# Patient Record
Sex: Male | Born: 2000 | Hispanic: No | Marital: Single
Health system: Southern US, Community
[De-identification: ages and names within clinical notes are randomized; demographics above are authoritative.]

---

## 2020-05-23 ENCOUNTER — Emergency Department (HOSPITAL_COMMUNITY): Payer: BC Managed Care – PPO

## 2020-05-23 ENCOUNTER — Emergency Department (HOSPITAL_COMMUNITY)
Admission: EM | Admit: 2020-05-23 | Discharge: 2020-05-23 | Disposition: A | Discharge status: Home / Self Care | Payer: BC Managed Care – PPO | Attending: Emergency Medicine | Admitting: Emergency Medicine

## 2020-05-23 ENCOUNTER — Other Ambulatory Visit: Payer: Self-pay

## 2020-05-23 DIAGNOSIS — R109 Unspecified abdominal pain: Secondary | ICD-10-CM | POA: Diagnosis present

## 2020-05-23 DIAGNOSIS — N200 Calculus of kidney: Secondary | ICD-10-CM | POA: Insufficient documentation

## 2020-05-23 LAB — CBC WITH DIFFERENTIAL/PLATELET
Abs Immature Granulocytes: 0.02 10*3/uL (ref 0.00–0.07)
Basophils Absolute: 0 10*3/uL (ref 0.0–0.1)
Basophils Relative: 0 %
Eosinophils Absolute: 0.1 10*3/uL (ref 0.0–0.5)
Eosinophils Relative: 2 %
HCT: 44.5 % (ref 39.0–52.0)
Hemoglobin: 15 g/dL (ref 13.0–17.0)
Immature Granulocytes: 0 %
Lymphocytes Relative: 34 %
Lymphs Abs: 2.6 10*3/uL (ref 0.7–4.0)
MCH: 28.7 pg (ref 26.0–34.0)
MCHC: 33.7 g/dL (ref 30.0–36.0)
MCV: 85.1 fL (ref 80.0–100.0)
Monocytes Absolute: 0.4 10*3/uL (ref 0.1–1.0)
Monocytes Relative: 5 %
Neutro Abs: 4.4 10*3/uL (ref 1.7–7.7)
Neutrophils Relative %: 59 %
Platelets: 244 10*3/uL (ref 150–400)
RBC: 5.23 MIL/uL (ref 4.22–5.81)
RDW: 12.5 % (ref 11.5–15.5)
WBC: 7.5 10*3/uL (ref 4.0–10.5)
nRBC: 0 % (ref 0.0–0.2)

## 2020-05-23 LAB — COMPREHENSIVE METABOLIC PANEL
ALT: 18 U/L (ref 0–44)
AST: 16 U/L (ref 15–41)
Albumin: 4.6 g/dL (ref 3.5–5.0)
Alkaline Phosphatase: 57 U/L (ref 38–126)
Anion gap: 11 (ref 5–15)
BUN: 15 mg/dL (ref 6–20)
CO2: 21 mmol/L — ABNORMAL LOW (ref 22–32)
Calcium: 9.6 mg/dL (ref 8.9–10.3)
Chloride: 104 mmol/L (ref 98–111)
Creatinine, Ser: 0.97 mg/dL (ref 0.61–1.24)
GFR calc non Af Amer: >60 mL/min (ref >60)
Glucose, Bld: 128 mg/dL — ABNORMAL HIGH (ref 70–99)
Potassium: 3.3 mmol/L — ABNORMAL LOW (ref 3.5–5.1)
Sodium: 136 mmol/L (ref 135–145)
Total Bilirubin: 1.1 mg/dL (ref 0.3–1.2)
Total Protein: 7.3 g/dL (ref 6.5–8.1)

## 2020-05-23 LAB — URINALYSIS, ROUTINE W REFLEX MICROSCOPIC
Bilirubin Urine: NEGATIVE
Glucose, UA: NEGATIVE mg/dL
Ketones, ur: NEGATIVE mg/dL
Leukocytes,Ua: NEGATIVE
Nitrite: NEGATIVE
Protein, ur: 30 mg/dL — AB
RBC / HPF: >50 RBC/hpf — ABNORMAL HIGH (ref 0–5)
Specific Gravity, Urine: 1.008 (ref 1.005–1.030)
pH: 6 (ref 5.0–8.0)

## 2020-05-23 LAB — LIPASE, BLOOD: Lipase: 28 U/L (ref 11–51)

## 2020-05-23 MED ORDER — IBUPROFEN 800 MG PO TABS: 800.0000 mg | ORAL_TABLET | Freq: Three times a day (TID) | ORAL | 0 refills | Status: AC

## 2020-05-23 MED ORDER — KETOROLAC TROMETHAMINE 30 MG/ML IJ SOLN
30.0000 mg | Freq: Once | INTRAMUSCULAR | Status: AC
  Administered 2020-05-23: 30 mg via INTRAVENOUS
  Filled 2020-05-23: qty 1

## 2020-05-23 NOTE — ED Triage Notes (Signed)
Patient brought in by [facility] for c/o from [facility] dorms abdominal pain with blood and urine. 128/82,78,99%ra,18.

## 2020-05-23 NOTE — ED Provider Notes (Signed)
[facility]-EMERGENCY DEPT Provider Note   CSN: [id number] Arrival date & time: 05/23/20  0636     History Chief Complaint  Patient presents with   Abdominal Pain    Patient here with c/o abdominal pain with nausea and vomiting.    William Houston is a 19 y.o. male.  HPI 19 year old male with no significant medical history resents to the ER with a sudden onset of left-sided back pain which occurred yesterday.  Patient states that the pain was sharp and stabbing, however subsided throughout the evening.  He states he wrote it off and went to bed.  He then awoke several hours later with another bout of left-sided sharp pain.  He has had some nausea, and one episode of nonbloody nonbilious vomiting.  He also noticed frank blood in his urine.  He does not have a history of kidney stones, but states that his father and his grandfather has them.  He suspects that this is the etiology of his pain.  He denies any fevers or chills.  Denies any dysuria, diarrhea, constipation.  No congestion, cough, chest pain, shortness of breath.  He had not taken anything for his pain, but was given something by EMS and states that his pain has now improved.  Denies any penile discharge, pain, swelling, no testicular pain.  Has been able to urinate without difficulty.    No past medical history on file.  There are no problems to display for this patient.   The histories are not reviewed yet. Please review them in the "History" navigator section and refresh this SmartLink.     No family history on file.  Social History   Tobacco Use   Smoking status: Not on file  Substance Use Topics   Alcohol use: Not on file   Drug use: Not on file    Home Medications Prior to Admission medications   Medication Sig Start Date End Date Taking? Authorizing Provider  ibuprofen (ADVIL) 800 MG tablet Take 1 tablet (800 mg total) by mouth 3 (three) times daily. 05/23/20   Mare Ferrari, PA-C     Allergies    Patient has no known allergies.  Review of Systems   Review of Systems  Constitutional: Negative for chills and fever.  HENT: Negative for ear pain and sore throat.   Eyes: Negative for pain and visual disturbance.  Respiratory: Negative for cough and shortness of breath.   Cardiovascular: Negative for chest pain and palpitations.  Gastrointestinal: Positive for abdominal pain, nausea and vomiting. Negative for constipation and diarrhea.  Genitourinary: Positive for hematuria. Negative for decreased urine volume, discharge, dysuria, penile pain, penile swelling and urgency.  Musculoskeletal: Negative for arthralgias and back pain.  Skin: Negative for color change and rash.  Neurological: Negative for seizures and syncope.  All other systems reviewed and are negative.   Physical Exam Updated Vital Signs BP 106/72    Pulse 65    Temp 97.6 F (36.4 C) (Oral)    Resp 17    Ht 5\' 10"  (1.778 m)    Wt 50.8 kg    SpO2 99%    BMI 16.07 kg/m   Physical Exam Vitals and nursing note reviewed.  Constitutional:      General: He is not in acute distress.    Appearance: He is well-developed. He is not ill-appearing, toxic-appearing or diaphoretic.  HENT:     Head: Normocephalic and atraumatic.  Eyes:     Conjunctiva/sclera: Conjunctivae normal.  Cardiovascular:  Rate and Rhythm: Normal rate and regular rhythm.     Heart sounds: Normal heart sounds. No murmur heard.   Pulmonary:     Effort: Pulmonary effort is normal. No respiratory distress.     Breath sounds: Normal breath sounds.  Abdominal:     General: Abdomen is flat. Bowel sounds are normal.     Palpations: Abdomen is soft.     Tenderness: There is no abdominal tenderness. There is left CVA tenderness (Mild left-sided CVA tenderness). There is no right CVA tenderness. Negative signs include Murphy's sign and McBurney's sign.  Musculoskeletal:     Cervical back: Neck supple.  Skin:    General: Skin is warm  and dry.     Coloration: Skin is not jaundiced, mottled or pale.  Neurological:     General: No focal deficit present.     Mental Status: He is alert.  Psychiatric:        Mood and Affect: Mood normal.        Behavior: Behavior normal.     ED Results / Procedures / Treatments   Labs (all labs ordered are listed, but only abnormal results are displayed) Labs Reviewed  COMPREHENSIVE METABOLIC PANEL - Abnormal; Notable for the following components:      Result Value   Potassium 3.3 (*)    CO2 21 (*)    Glucose, Bld 128 (*)    All other components within normal limits  URINALYSIS, ROUTINE W REFLEX MICROSCOPIC - Abnormal; Notable for the following components:   Hgb urine dipstick LARGE (*)    Protein, ur 30 (*)    RBC / HPF >50 (*)    Bacteria, UA FEW (*)    All other components within normal limits  CBC WITH DIFFERENTIAL/PLATELET  LIPASE, BLOOD    EKG None  Radiology CT Renal Stone Study  Result Date: 05/23/2020 CLINICAL DATA:  Acute left flank pain. EXAM: CT ABDOMEN AND PELVIS WITHOUT CONTRAST TECHNIQUE: Multidetector CT imaging of the abdomen and pelvis was performed following the standard protocol without IV contrast. COMPARISON:  None. FINDINGS: Lower chest: No acute abnormality. Hepatobiliary: No focal liver abnormality is seen. No gallstones, gallbladder wall thickening, or biliary dilatation. Pancreas: Unremarkable. No pancreatic ductal dilatation or surrounding inflammatory changes. Spleen: Normal in size without focal abnormality. Adrenals/Urinary Tract: Adrenal glands are unremarkable. Right kidney and ureter are unremarkable. Mild left hydronephrosis is noted secondary to 5 mm calculus in the proximal left ureter. Urinary bladder is unremarkable. Stomach/Bowel: Stomach is within normal limits. Appendix appears normal. No evidence of bowel wall thickening, distention, or inflammatory changes. Vascular/Lymphatic: No significant vascular findings are present. No enlarged  abdominal or pelvic lymph nodes. Reproductive: Prostate is unremarkable. Other: No abdominal wall hernia or abnormality. No abdominopelvic ascites. Musculoskeletal: No acute or significant osseous findings. IMPRESSION: Mild left hydronephrosis is noted secondary to 5 mm proximal left ureteral calculus. Electronically Signed   By: Lupita Raider M.D.   On: 05/23/2020 08:23    Procedures Procedures (including critical care time)  Medications Ordered in ED Medications - No data to display  ED Course  I have reviewed the triage vital signs and the nursing notes.  Pertinent labs & imaging results that were available during my care of the patient were reviewed by me and considered in my medical decision making (see chart for details).    MDM Rules/Calculators/A&P  19 year old male with left flank pain Presentation, he is alert, oriented, nontoxic-appearing, in no acute distress.  His physical exam is positive for left-sided flank tenderness, however no right lower quadrant tenderness, no left lower quadrant tenderness and negative Murphy's.  His vitals are overall reassuring.  His CBC is without leukocytosis, mild hypokalemia of 3.3, glucose of 128.  His lipase is normal.  His UA is consistent with large hemoglobin and mild proteinuria.  His CT renal study does confirm a left ureteral calculus with mild left hydronephrosis.  Evidence of obstruction.  Patient's pain under control, provided additional toradol.  Will send home with 800 mg of ibuprofen.  Encouraged to follow-up with urology.  Return precautions discussed.  He voiced understanding and is agreeable.  At this stage in the ED course, the patient medically screened and stable for discharge.  Final Clinical Impression(s) / ED Diagnoses Final diagnoses:  Kidney stone on left side    Rx / DC Orders ED Discharge Orders         Ordered    ibuprofen (ADVIL) 800 MG tablet  3 times daily        05/23/20 0909            Mare Ferrari, PA-C 05/23/20 6122    Lorre Nick, MD 05/27/20 1404

## 2020-05-23 NOTE — Discharge Instructions (Addendum)
Thank you for letting us take care of you in the ER  Your work-up today showed that you do have a  kidney stone in the left kidney.  Please take  800mg  of Ibuprofen up to 3 times daily for pain.  Please follow-up with Dr. with urology, whose contact information is provided in your discharge paperwork.  Return to the ER for any new or worsening symptoms.

## 2021-03-13 IMAGING — CT CT RENAL STONE PROTOCOL
2 of 4 series · 16 of 46 positions shown, 18 images · non-contrast
Comparison: None.

CLINICAL DATA: Acute left flank pain.

EXAM:
CT ABDOMEN AND PELVIS WITHOUT CONTRAST
TECHNIQUE: Multidetector CT imaging of the abdomen and pelvis was performed
following the standard protocol without IV contrast.

[Series 2: axial st · axial · 0.56mm/px · z∈[-456,-76]mm · 13 of 86 slices shown, 15 images]
[im 5/86  soft-tissue]
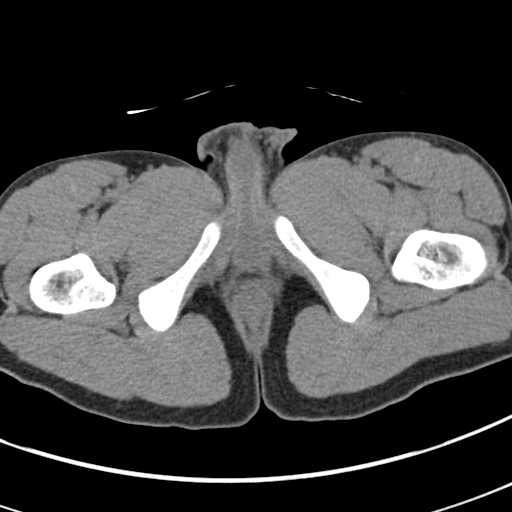
[im 5/86  bone]
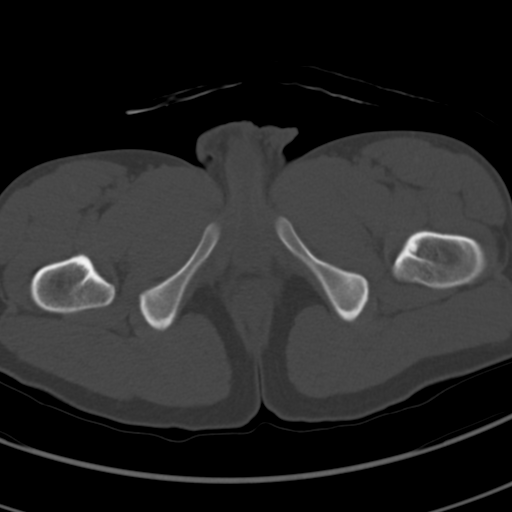
[im 10/86  soft-tissue]
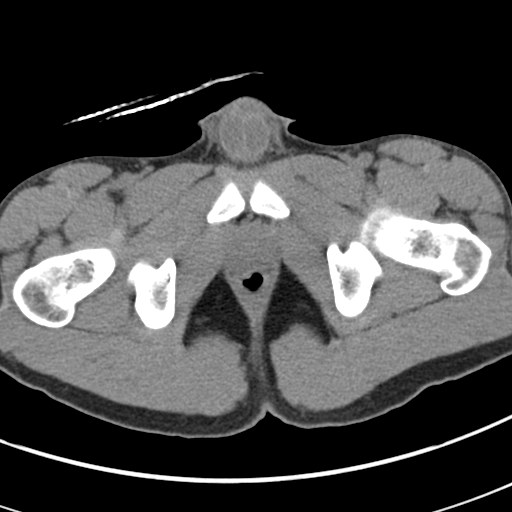
[im 19/86  soft-tissue]
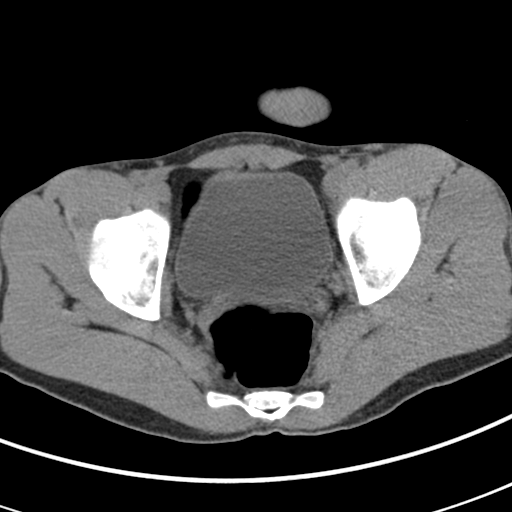
[im 24/86  soft-tissue]
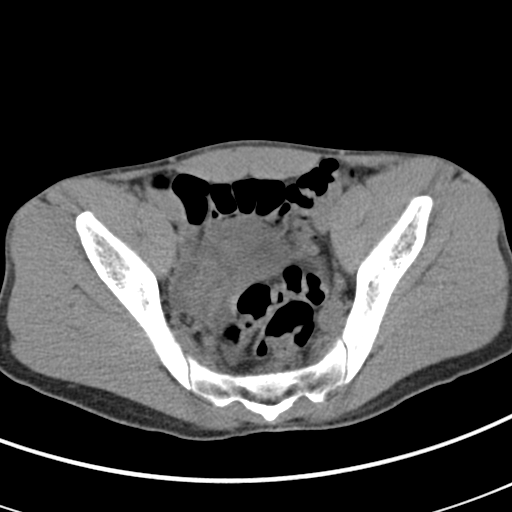
[im 29/86  soft-tissue]
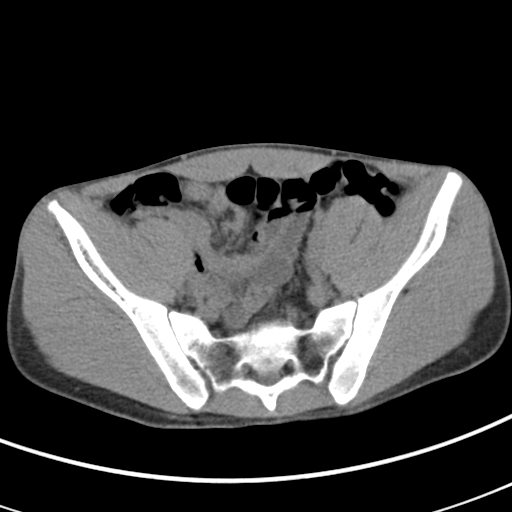
[im 38/86  soft-tissue]
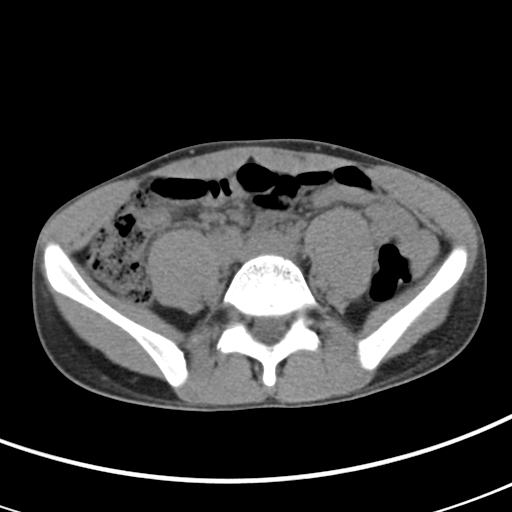
[im 43/86  soft-tissue]
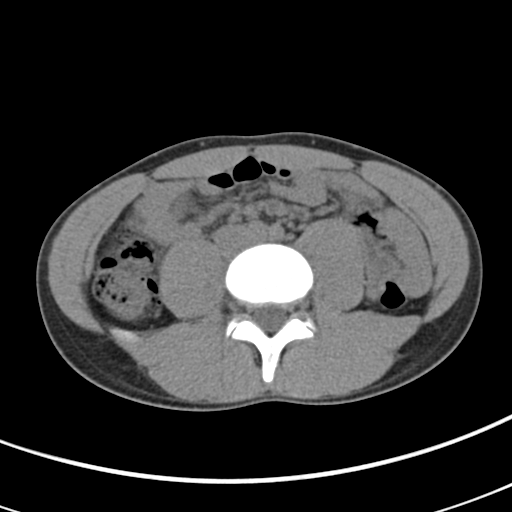
[im 48/86  soft-tissue]
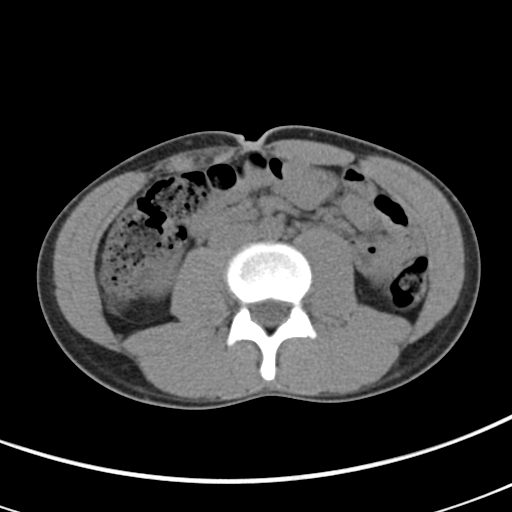
[im 57/86  soft-tissue]
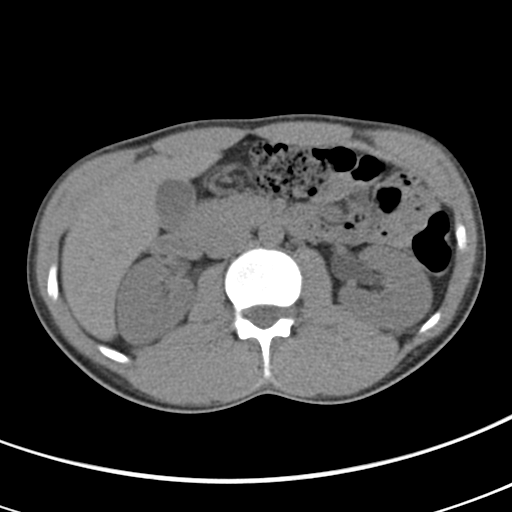
[im 57/86  bone]
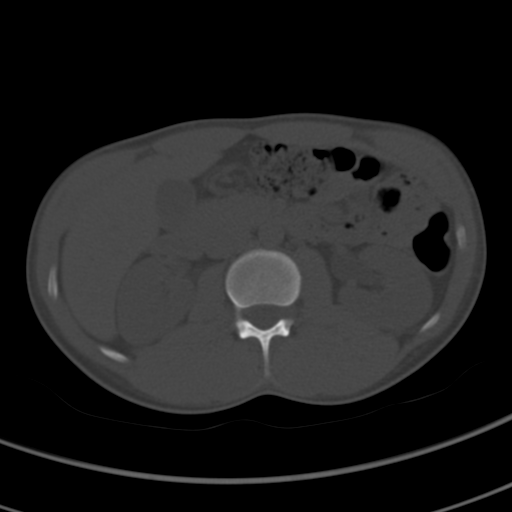
[im 62/86  soft-tissue]
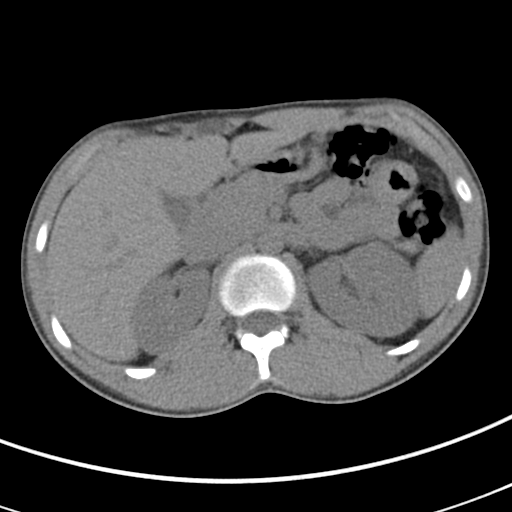
[im 67/86  soft-tissue]
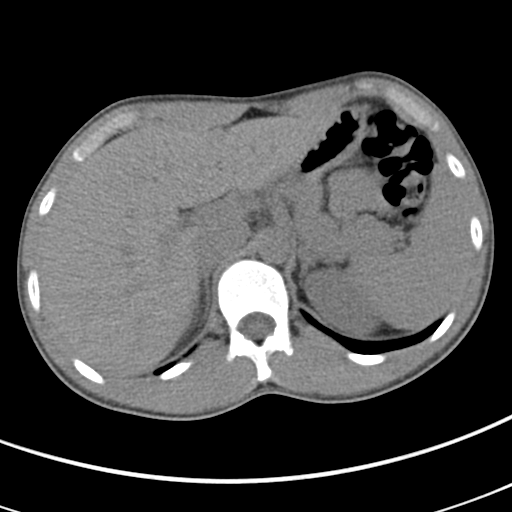
[im 76/86  soft-tissue]
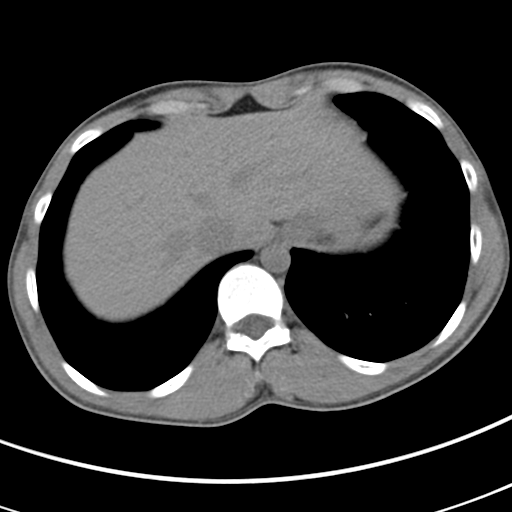
[im 81/86  soft-tissue]
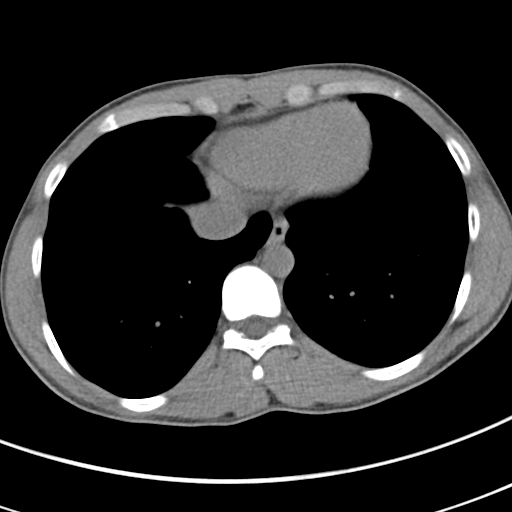

[Series 4: coronal · coronal · 0.72mm/px · 3 of 107 slices shown]
[im 36/107  soft-tissue]
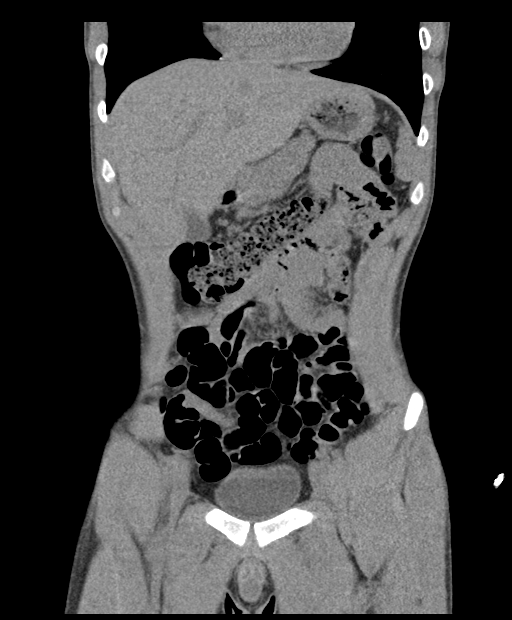
[im 48/107  soft-tissue]
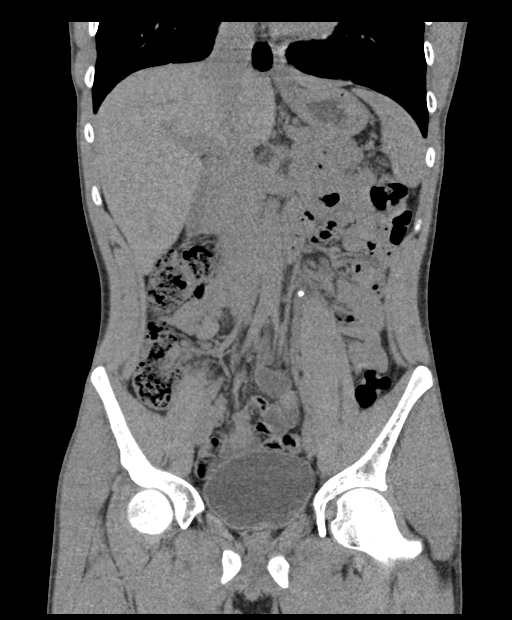
[im 59/107  soft-tissue]
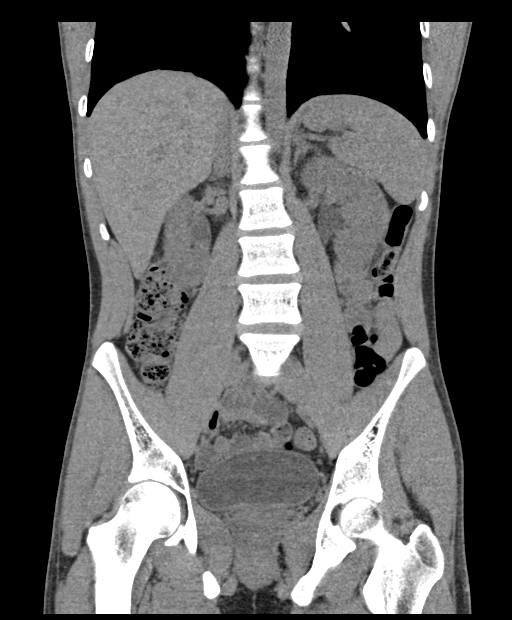

[16 of 46 positions shown; findings below may reference images not displayed]

FINDINGS: Lower chest: No acute abnormality.

Hepatobiliary: No focal liver abnormality is seen. No gallstones,
gallbladder wall thickening, or biliary dilatation.

Pancreas: Unremarkable. No pancreatic ductal dilatation or
surrounding inflammatory changes.

Spleen: Normal in size without focal abnormality.

Adrenals/Urinary Tract: Adrenal glands are unremarkable. Right
kidney and ureter are unremarkable. Mild left hydronephrosis is
noted secondary to 5 mm calculus in the proximal left ureter.
Urinary bladder is unremarkable.

Stomach/Bowel: Stomach is within normal limits. Appendix appears
normal. No evidence of bowel wall thickening, distention, or
inflammatory changes.

Vascular/Lymphatic: No significant vascular findings are present. No
enlarged abdominal or pelvic lymph nodes.

Reproductive: Prostate is unremarkable.

Other: No abdominal wall hernia or abnormality. No abdominopelvic
ascites.

Musculoskeletal: No acute or significant osseous findings.
IMPRESSION: Mild left hydronephrosis is noted secondary to 5 mm proximal left
ureteral calculus.
